# Patient Record
Sex: Female | Born: 1965 | Race: Black or African American | Hispanic: No | Marital: Married | Smoking: Never smoker
Health system: Southern US, Community
[De-identification: ages and names within clinical notes are randomized; demographics above are authoritative.]

---

## 1998-02-25 ENCOUNTER — Inpatient Hospital Stay (HOSPITAL_COMMUNITY): Admission: AD | Admit: 1998-02-25 | Discharge: 1998-02-25 | Payer: Self-pay | Admitting: Obstetrics and Gynecology

## 1998-09-12 ENCOUNTER — Ambulatory Visit (HOSPITAL_COMMUNITY): Admission: RE | Admit: 1998-09-12 | Discharge: 1998-09-12 | Payer: Self-pay | Admitting: Obstetrics and Gynecology

## 1999-02-28 ENCOUNTER — Emergency Department (HOSPITAL_COMMUNITY): Admission: EM | Admit: 1999-02-28 | Discharge: 1999-02-28 | Payer: Self-pay | Admitting: Emergency Medicine

## 1999-05-07 ENCOUNTER — Other Ambulatory Visit: Admission: RE | Admit: 1999-05-07 | Discharge: 1999-05-07 | Payer: Self-pay | Admitting: Obstetrics and Gynecology

## 2001-08-31 ENCOUNTER — Other Ambulatory Visit: Admission: RE | Admit: 2001-08-31 | Discharge: 2001-08-31 | Payer: Self-pay | Admitting: Obstetrics and Gynecology

## 2002-10-06 ENCOUNTER — Other Ambulatory Visit: Admission: RE | Admit: 2002-10-06 | Discharge: 2002-10-06 | Payer: Self-pay | Admitting: Obstetrics and Gynecology

## 2003-10-08 ENCOUNTER — Other Ambulatory Visit: Admission: RE | Admit: 2003-10-08 | Discharge: 2003-10-08 | Payer: Self-pay | Admitting: Obstetrics and Gynecology

## 2004-10-10 ENCOUNTER — Other Ambulatory Visit: Admission: RE | Admit: 2004-10-10 | Discharge: 2004-10-10 | Payer: Self-pay | Admitting: Obstetrics and Gynecology

## 2005-10-15 ENCOUNTER — Other Ambulatory Visit: Admission: RE | Admit: 2005-10-15 | Discharge: 2005-10-15 | Payer: Self-pay | Admitting: Obstetrics and Gynecology

## 2006-09-30 ENCOUNTER — Other Ambulatory Visit: Admission: RE | Admit: 2006-09-30 | Discharge: 2006-09-30 | Payer: Self-pay | Admitting: Obstetrics and Gynecology

## 2007-08-04 ENCOUNTER — Other Ambulatory Visit: Admission: RE | Admit: 2007-08-04 | Discharge: 2007-08-04 | Payer: Self-pay | Admitting: Obstetrics and Gynecology

## 2008-08-02 ENCOUNTER — Encounter: Admission: RE | Admit: 2008-08-02 | Discharge: 2008-08-02 | Payer: Self-pay | Admitting: Obstetrics & Gynecology

## 2010-10-01 ENCOUNTER — Other Ambulatory Visit
Admission: RE | Admit: 2010-10-01 | Discharge: 2010-10-01 | Payer: Self-pay | Source: Home / Self Care | Admitting: Obstetrics and Gynecology

## 2010-11-24 ENCOUNTER — Encounter: Payer: Self-pay | Admitting: Obstetrics & Gynecology

## 2011-10-02 ENCOUNTER — Other Ambulatory Visit: Payer: Self-pay | Admitting: Obstetrics and Gynecology

## 2011-10-02 ENCOUNTER — Other Ambulatory Visit (HOSPITAL_COMMUNITY)
Admission: RE | Admit: 2011-10-02 | Discharge: 2011-10-02 | Disposition: A | Discharge status: Home / Self Care | Payer: BC Managed Care – PPO | Source: Ambulatory Visit | Attending: Obstetrics and Gynecology | Admitting: Obstetrics and Gynecology

## 2011-10-02 DIAGNOSIS — Z01419 Encounter for gynecological examination (general) (routine) without abnormal findings: Secondary | ICD-10-CM | POA: Insufficient documentation

## 2012-11-09 ENCOUNTER — Other Ambulatory Visit (HOSPITAL_COMMUNITY)
Admission: RE | Admit: 2012-11-09 | Discharge: 2012-11-09 | Disposition: A | Discharge status: Home / Self Care | Payer: BC Managed Care – PPO | Source: Ambulatory Visit | Attending: Obstetrics and Gynecology | Admitting: Obstetrics and Gynecology

## 2012-11-09 ENCOUNTER — Other Ambulatory Visit: Payer: Self-pay | Admitting: Obstetrics and Gynecology

## 2012-11-09 DIAGNOSIS — Z1151 Encounter for screening for human papillomavirus (HPV): Secondary | ICD-10-CM | POA: Insufficient documentation

## 2012-11-09 DIAGNOSIS — Z01419 Encounter for gynecological examination (general) (routine) without abnormal findings: Secondary | ICD-10-CM | POA: Insufficient documentation

## 2014-12-28 ENCOUNTER — Other Ambulatory Visit: Payer: Self-pay | Admitting: Obstetrics and Gynecology

## 2014-12-28 DIAGNOSIS — Z1231 Encounter for screening mammogram for malignant neoplasm of breast: Secondary | ICD-10-CM

## 2015-01-01 ENCOUNTER — Ambulatory Visit: Payer: Self-pay

## 2015-03-22 ENCOUNTER — Ambulatory Visit
Admission: RE | Admit: 2015-03-22 | Discharge: 2015-03-22 | Disposition: A | Discharge status: Home / Self Care | Payer: PRIVATE HEALTH INSURANCE | Source: Ambulatory Visit | Attending: Obstetrics and Gynecology | Admitting: Obstetrics and Gynecology

## 2015-03-22 DIAGNOSIS — Z1231 Encounter for screening mammogram for malignant neoplasm of breast: Secondary | ICD-10-CM

## 2017-01-27 ENCOUNTER — Encounter (HOSPITAL_COMMUNITY): Payer: Self-pay | Admitting: Emergency Medicine

## 2017-01-27 ENCOUNTER — Ambulatory Visit (HOSPITAL_COMMUNITY)
Admission: EM | Admit: 2017-01-27 | Discharge: 2017-01-27 | Disposition: A | Discharge status: Home / Self Care | Payer: Self-pay | Attending: Family Medicine | Admitting: Family Medicine

## 2017-01-27 DIAGNOSIS — S39012A Strain of muscle, fascia and tendon of lower back, initial encounter: Secondary | ICD-10-CM

## 2017-01-27 DIAGNOSIS — S335XXA Sprain of ligaments of lumbar spine, initial encounter: Secondary | ICD-10-CM

## 2017-01-27 MED ORDER — CYCLOBENZAPRINE HCL 5 MG PO TABS: 5.0000 mg | ORAL_TABLET | Freq: Three times a day (TID) | ORAL | 0 refills | Status: AC | PRN

## 2017-01-27 MED ORDER — NAPROXEN 250 MG PO TABS: 250.0000 mg | ORAL_TABLET | Freq: Two times a day (BID) | ORAL | 0 refills | Status: AC
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
## 2020-01-27 ENCOUNTER — Ambulatory Visit: Payer: PRIVATE HEALTH INSURANCE

## 2021-10-21 ENCOUNTER — Other Ambulatory Visit: Payer: Self-pay | Admitting: Obstetrics & Gynecology

## 2021-10-21 DIAGNOSIS — Z1231 Encounter for screening mammogram for malignant neoplasm of breast: Secondary | ICD-10-CM

## 2021-12-17 ENCOUNTER — Other Ambulatory Visit: Payer: Self-pay | Admitting: Obstetrics and Gynecology

## 2021-12-17 ENCOUNTER — Ambulatory Visit
Admission: RE | Admit: 2021-12-17 | Discharge: 2021-12-17 | Disposition: A | Discharge status: Home / Self Care | Payer: PRIVATE HEALTH INSURANCE | Source: Ambulatory Visit | Attending: Obstetrics & Gynecology | Admitting: Obstetrics & Gynecology

## 2021-12-17 DIAGNOSIS — Z1231 Encounter for screening mammogram for malignant neoplasm of breast: Secondary | ICD-10-CM

## 2022-11-06 ENCOUNTER — Encounter: Payer: Self-pay | Admitting: Family Medicine

## 2023-07-16 IMAGING — MG MM DIGITAL SCREENING BILAT W/ TOMO AND CAD
8 series · 8 of 24 positions shown · non-contrast
Comparison: Previous exam(s).

CLINICAL DATA: Screening.

EXAM:
DIGITAL SCREENING BILATERAL MAMMOGRAM WITH TOMOSYNTHESIS AND CAD
TECHNIQUE: Bilateral screening digital craniocaudal and mediolateral oblique
mammograms were obtained. Bilateral screening digital breast
tomosynthesis was performed. The images were evaluated with
computer-aided detection.

[L MLO synth-2D]
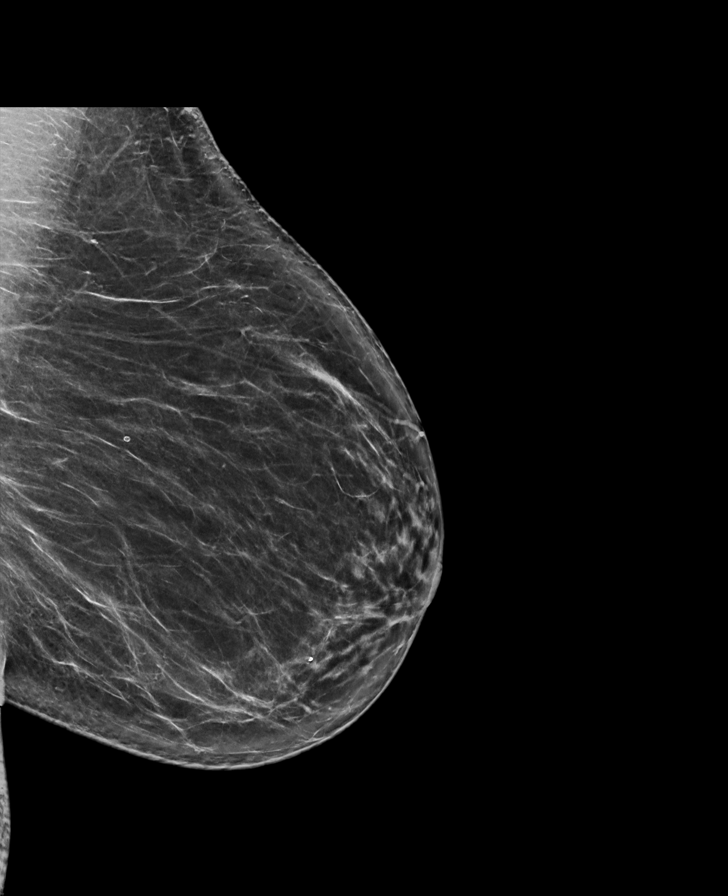

[R CC synth-2D]
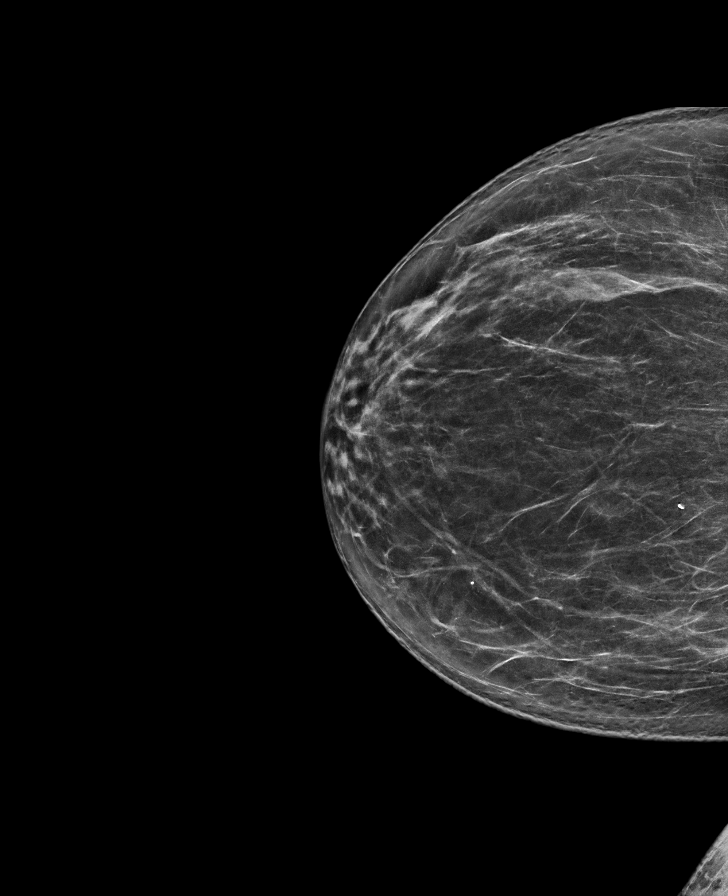

[R MLO synth-2D]
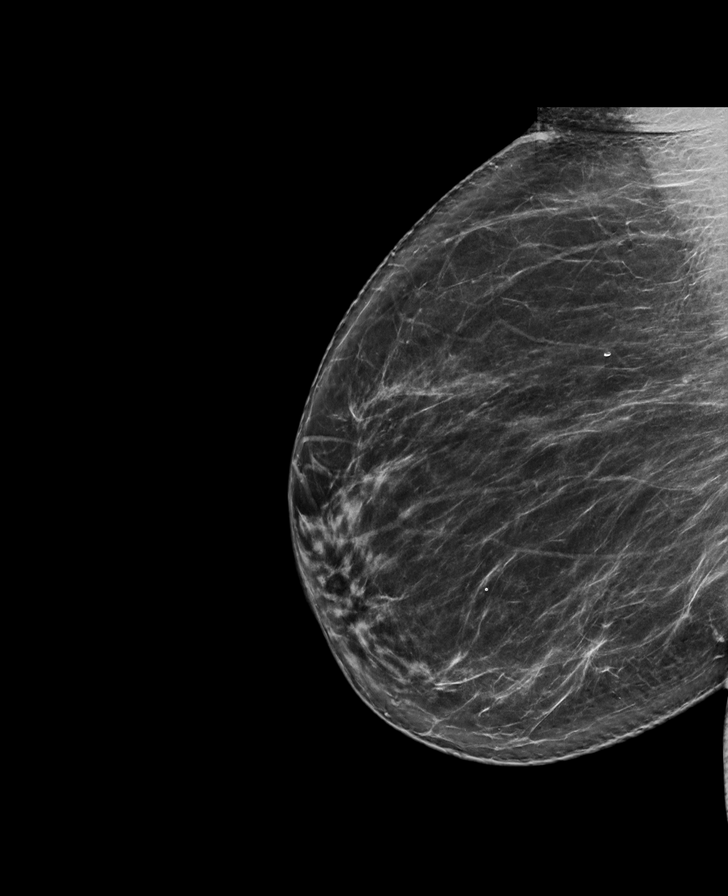

[L CC synth-2D]
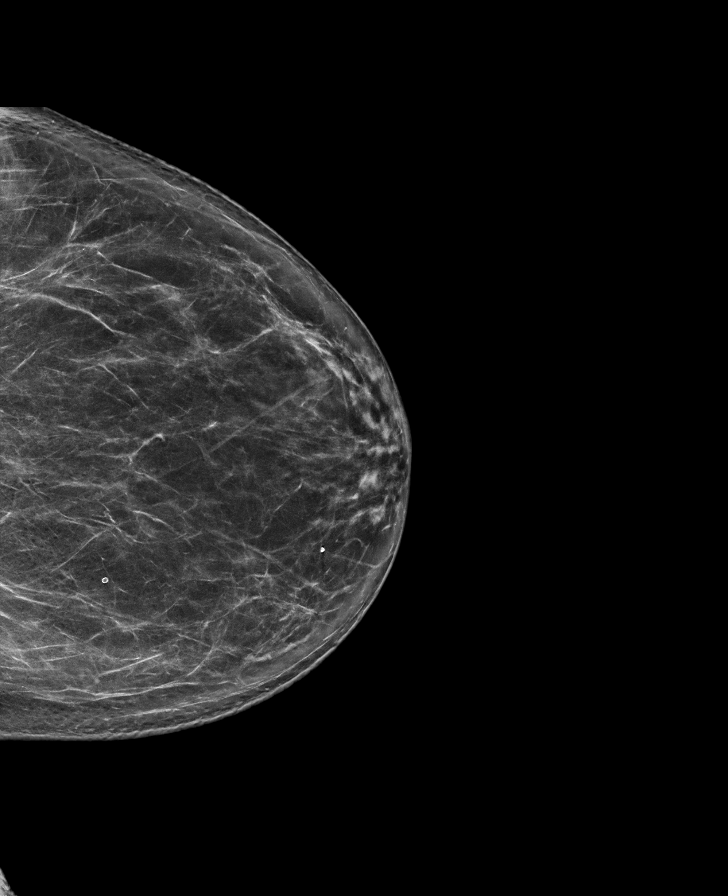

[R CC tomo · tomo slice 41/81.0]
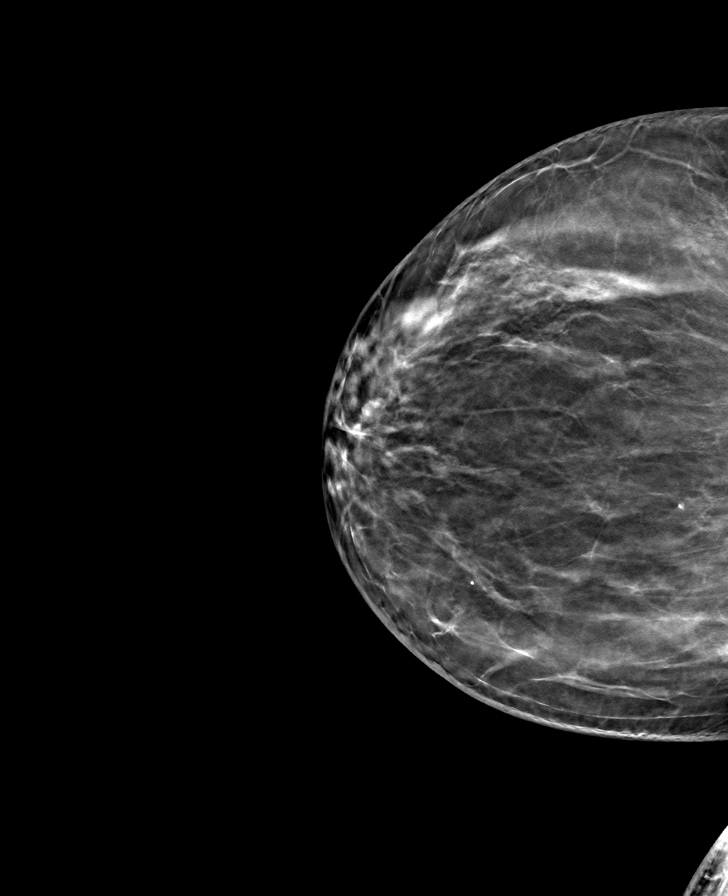

[L MLO tomo · tomo slice 45/88.0]
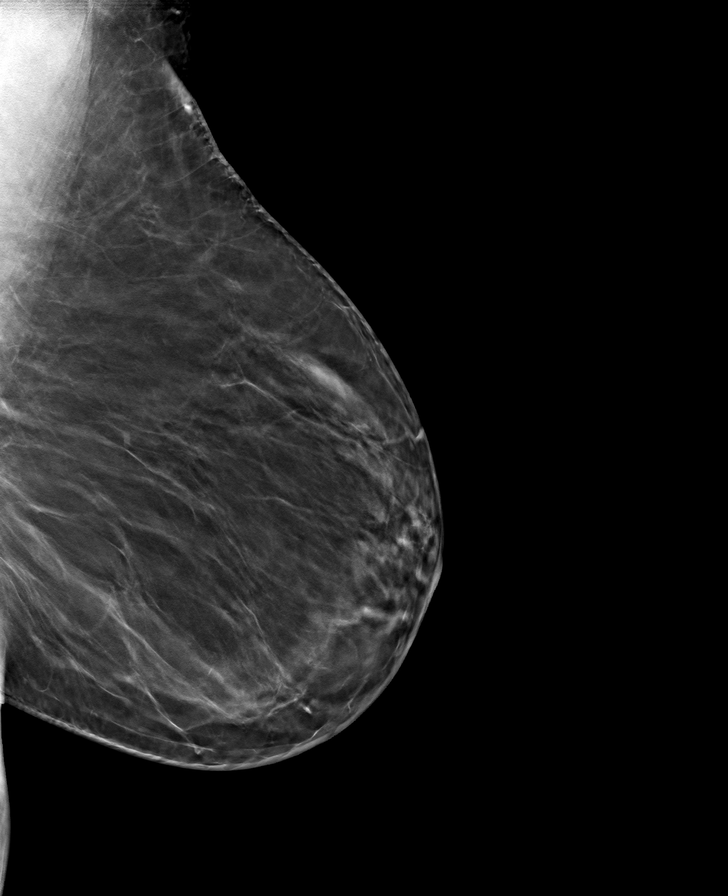

[L CC tomo · tomo slice 42/83.0]
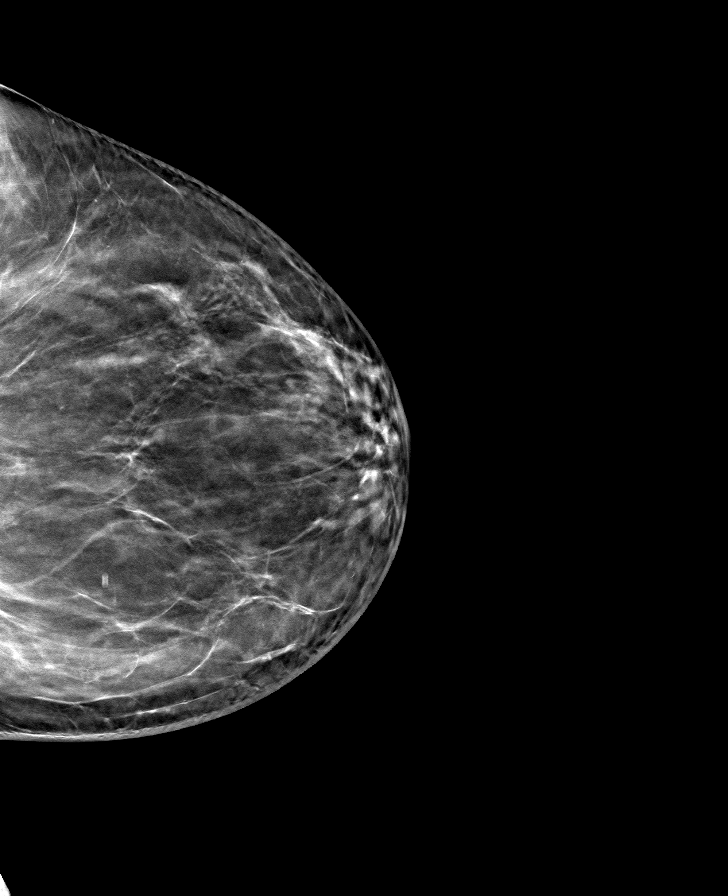

[R MLO tomo · tomo slice 45/88.0]
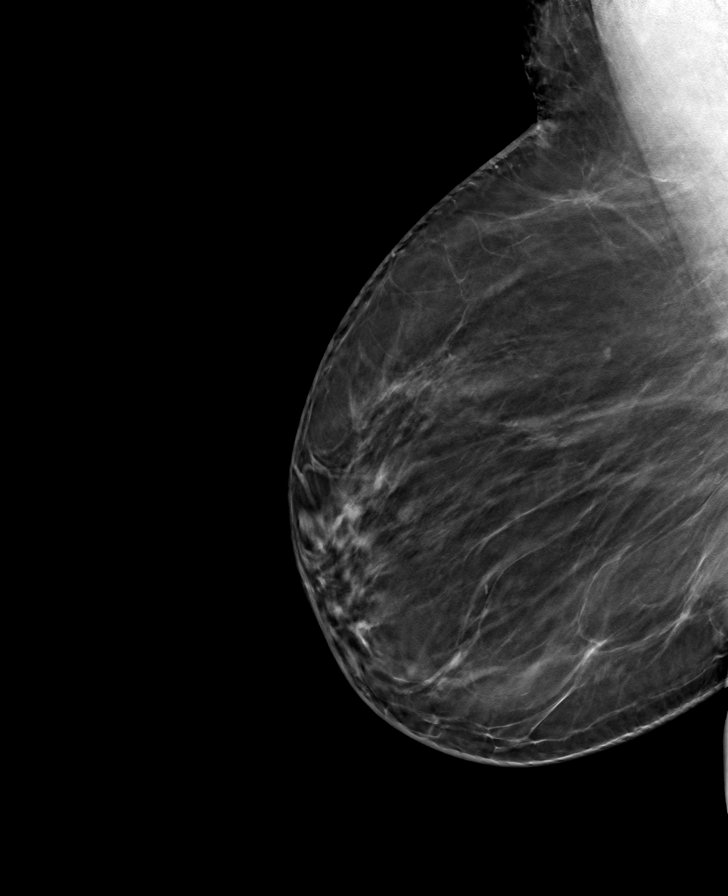

[8 of 24 positions shown; findings below may reference images not displayed]

ACR Breast Density Category b: There are scattered areas of
fibroglandular density.
FINDINGS: There are no findings suspicious for malignancy.
IMPRESSION: No mammographic evidence of malignancy. A result letter of this
screening mammogram will be mailed directly to the patient.

RECOMMENDATION:
Screening mammogram in one year. (Code:51-O-LD2)

BI-RADS CATEGORY  1: Negative.
